# Patient Record
Sex: Male | Born: 2000 | Race: Black or African American | Hispanic: No | Marital: Single | Smoking: Never smoker
Health system: Southern US, Community
[De-identification: ages and names within clinical notes are randomized; demographics above are authoritative.]

---
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
## 2018-03-04 IMAGING — US US SCROTUM
1 series · 14 of 25 positions shown · non-contrast
Comparison: None.

CLINICAL DATA: Left testicular pain.

EXAM:
SCROTAL ULTRASOUND
DOPPLER ULTRASOUND OF THE TESTICLES
TECHNIQUE: Complete ultrasound examination of the testicles, epididymis, and
other scrotal structures was performed. Color and spectral Doppler
ultrasound were also utilized to evaluate blood flow to the
testicles.

[Series 1: us scrotum · 0.08mm/px · 47 acquisitions, 14 frames shown]
[im 1/47]
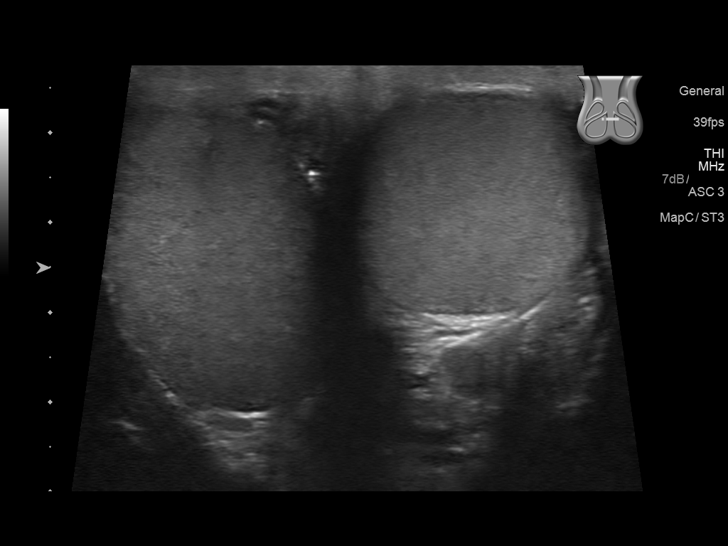
[im 4/47]
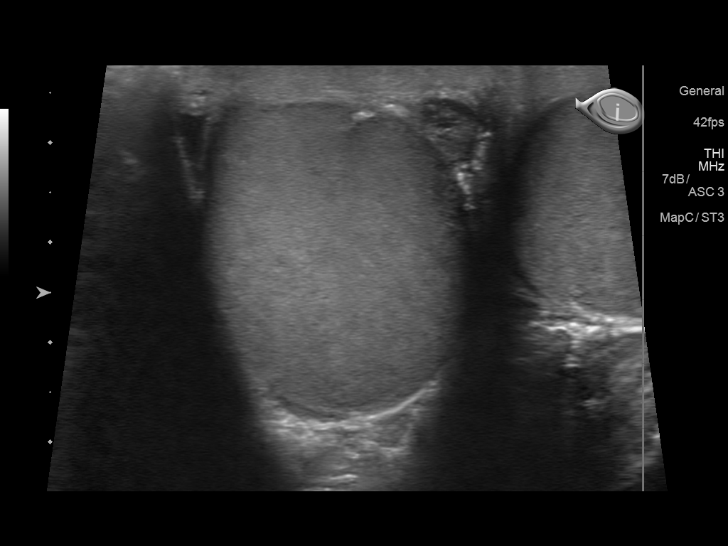
[im 8/47]
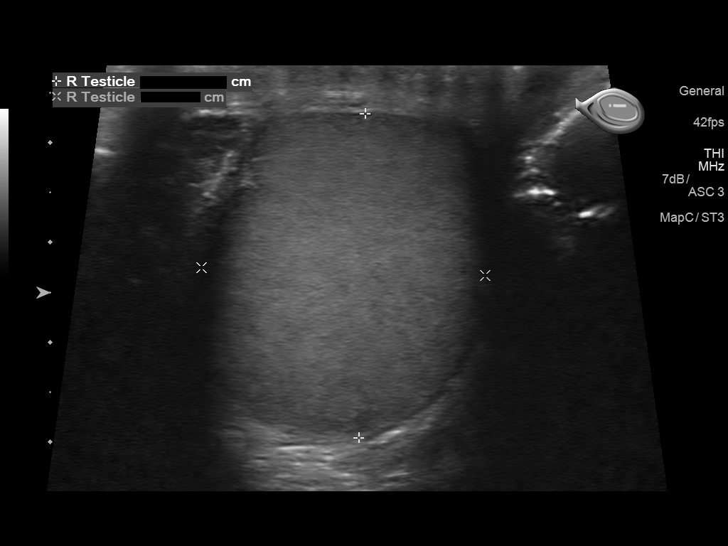
[im 12/47]
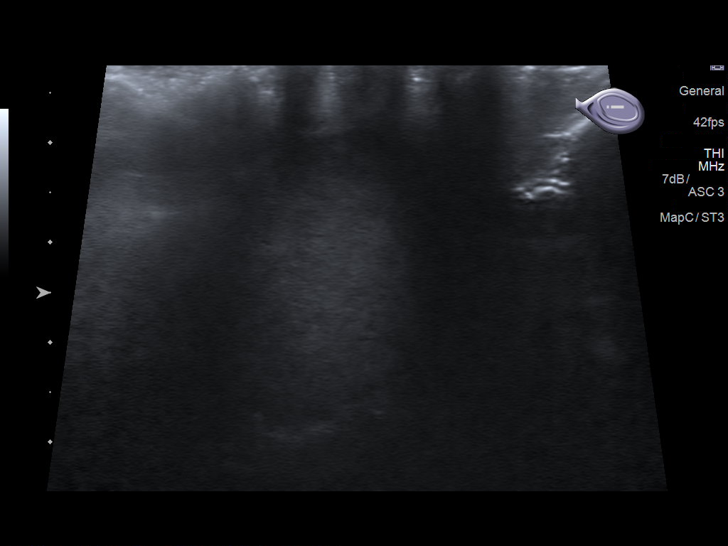
[im 16/47]
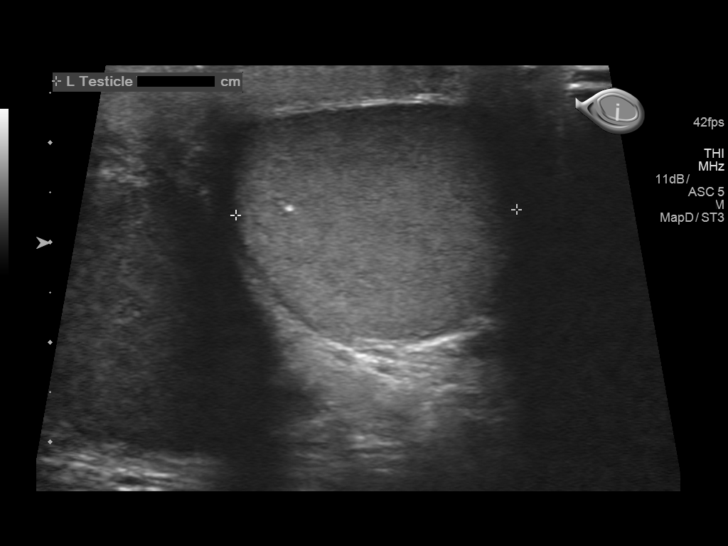
[im 18/47]
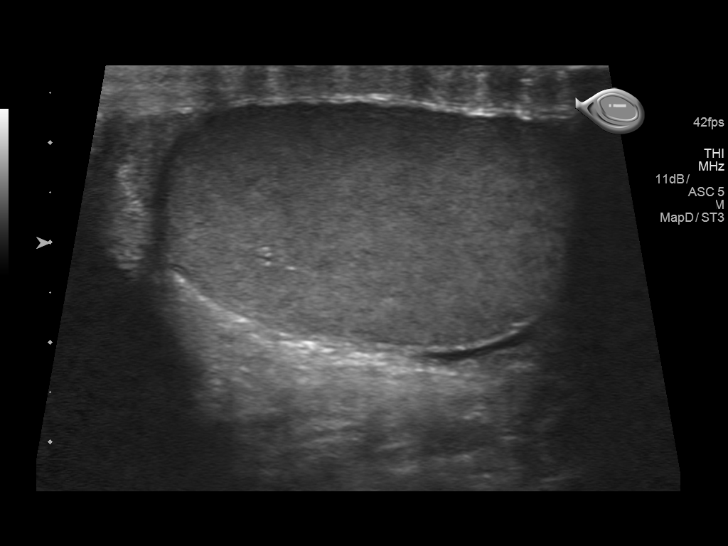
[im 22/47]
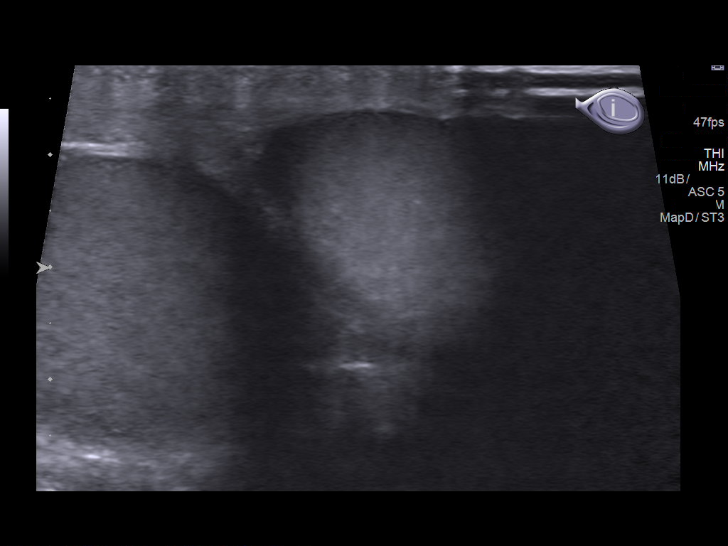
[im 25/47]
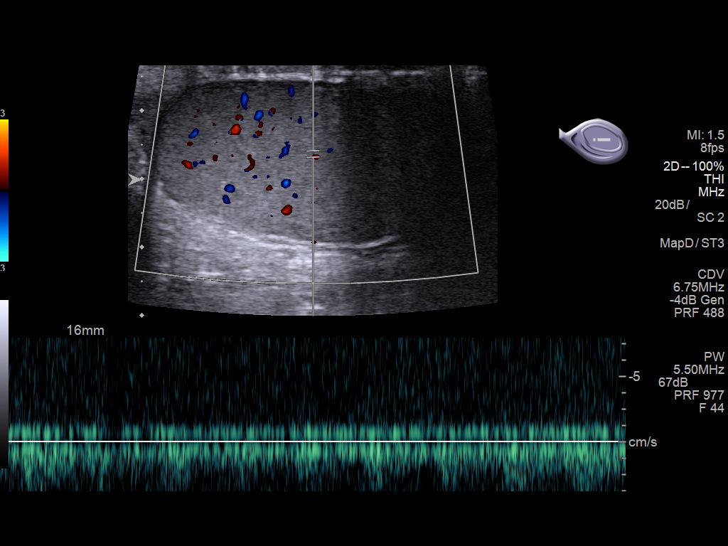
[im 29/47]
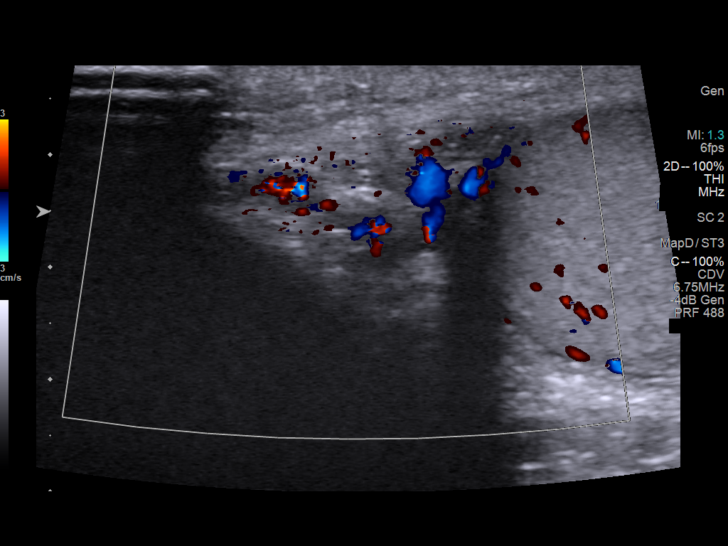
[im 31/47]
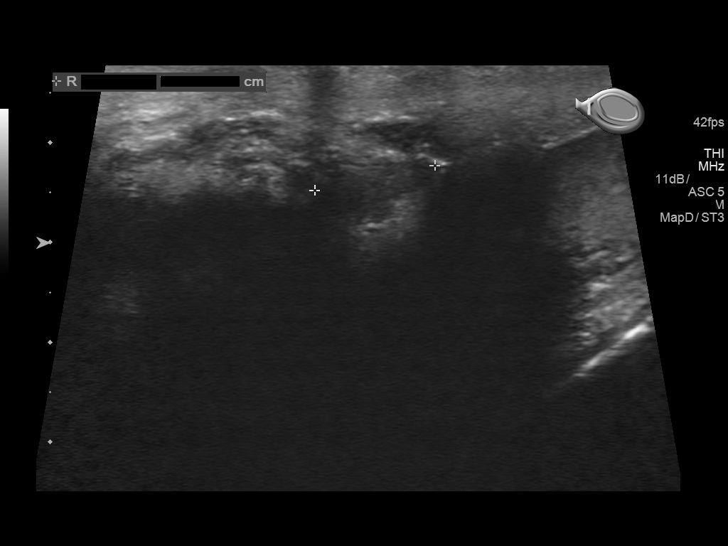
[im 35/47]
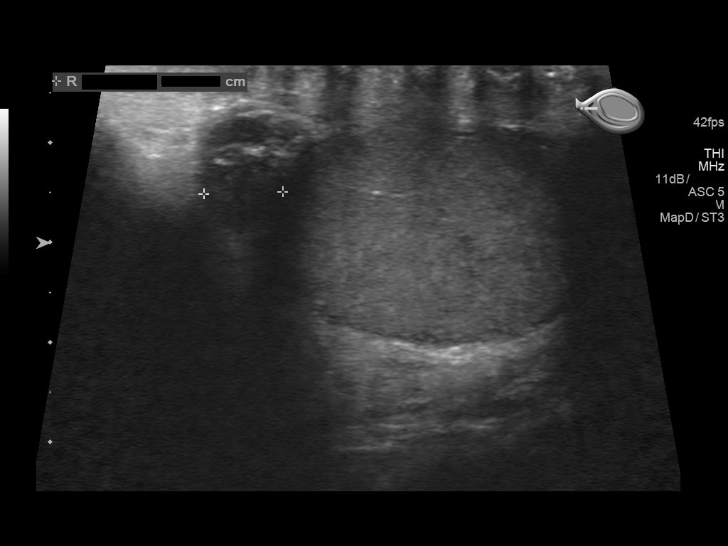
[im 39/47]
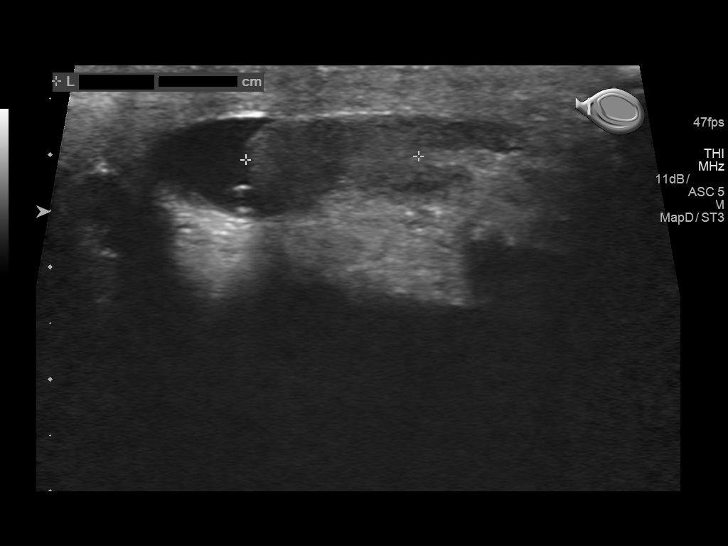
[im 43/47]
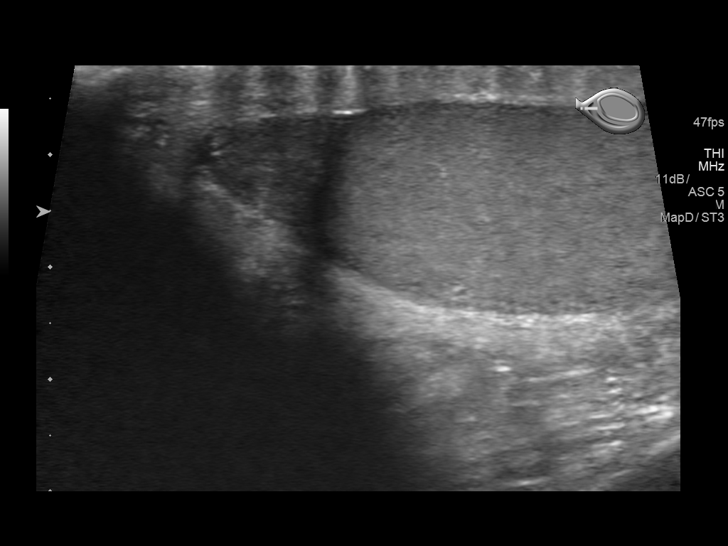
[im 47/47]
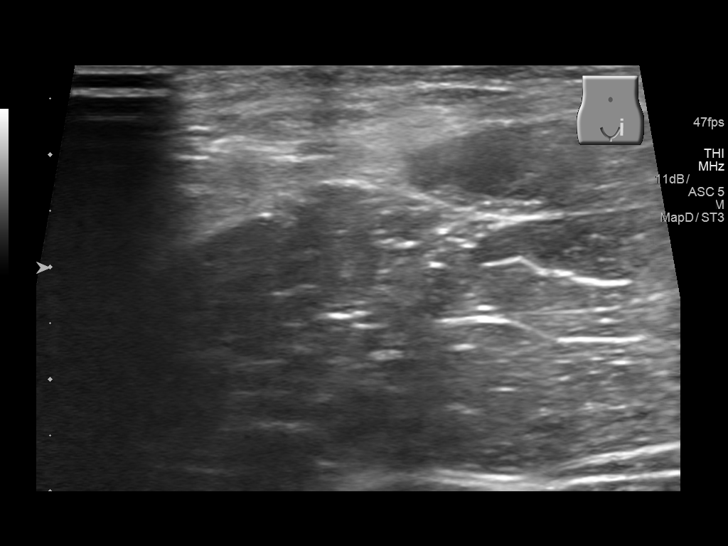

[14 of 25 positions shown; findings below may reference images not displayed]

FINDINGS: Right testicle

Measurements: 3.3 x 2.8 x 2.7 cm. No mass or microlithiasis
visualized.

Left testicle

Measurements: 4.3 x 2.4 x 2.8 cm. No mass visualized. Solitary
punctate left testicular calcification, without left testicular
microlithiasis.

Right epididymis:  Normal in size and appearance.

Left epididymis:  Normal in size and appearance.

Hydrocele:  None visualized.

Varicocele:  None visualized.

Pulsed Doppler interrogation of both testes demonstrates normal low
resistance arterial and venous waveforms bilaterally.
IMPRESSION: No evidence of testicular torsion. No significant scrotal
abnormality.
